# Patient Record
Sex: Female | Born: 1990 | Race: Black or African American | Hispanic: No | Marital: Single | Smoking: Never smoker
Health system: Southern US, Community
[De-identification: ages and names within clinical notes are randomized; demographics above are authoritative.]

## PROBLEM LIST (undated history)

## (undated) DIAGNOSIS — E079 Disorder of thyroid, unspecified: Secondary | ICD-10-CM

---

## 2019-06-22 ENCOUNTER — Encounter: Payer: Self-pay | Admitting: Physician Assistant

## 2019-06-22 ENCOUNTER — Ambulatory Visit (LOCAL_COMMUNITY_HEALTH_CENTER): Payer: Self-pay | Admitting: Physician Assistant

## 2019-06-22 ENCOUNTER — Other Ambulatory Visit: Payer: Self-pay

## 2019-06-22 VITALS — BP 123/82 | Ht 63.0 in | Wt 265.0 lb

## 2019-06-22 DIAGNOSIS — Z3009 Encounter for other general counseling and advice on contraception: Secondary | ICD-10-CM

## 2019-06-22 DIAGNOSIS — E079 Disorder of thyroid, unspecified: Secondary | ICD-10-CM

## 2019-06-22 NOTE — Progress Notes (Signed)
Pt here with concerns about irregular periods. Pt reports last period was 01/2019 that was abnormal. Pt also reports abnormal pap 2015 and had a LEEP procedure after that at [facility].Ronny Bacon, RN

## 2019-06-25 ENCOUNTER — Encounter: Payer: Self-pay | Admitting: Physician Assistant

## 2019-06-25 DIAGNOSIS — E079 Disorder of thyroid, unspecified: Secondary | ICD-10-CM | POA: Insufficient documentation

## 2019-06-25 NOTE — Progress Notes (Signed)
Family Planning Visit  Subjective:  Grace Hodge is a 28 y.o. being seen today to  discuss irregular bleeding.    She is currently using none for pregnancy prevention. Patient reports she does not  want a pregnancy in the next year. Patient  does not have a problem list on file.  Chief Complaint  Patient presents with  . Contraception    Patient reports that she is concerned that she has not had a period since June.  States that she has had a history of irregular periods in the past.  States that had been given "some medicine" in the past to get her period restarted after not having one for a long time.  States that she had a baby in 2013 and then had a Implanon placed.  In 2016, after the Implanon was removed she had irregular periods for a while then got pregnant and delivered 04/2017 by C-section and bled for 2 months, then did not have a period for all of 2019.  Started having periods regularly again in January of this year and had regular periods through May, had 2 periods in June and then none since then.  Patient is G4P3,had EAB in 2010 and has had C-section x 2. Last PE and pap 2018 during/just after pregnancy.  Patient has a thyroid disorder but has not had any follow up since 2018.  Patient states that she has done several pregnancy tests at home since she has not had a period and they are all negative.  States that the last pregnancy test that she has done was on Monday and was negative.  Last sex was 06/21/2019 and without condoms.  States that she is not interested in any hormonal BCM at this time.   Patient denies any other chronic conditions.  Denies any contributory family history.     Does the patient desire a pregnancy in the next year? (OKQ flowsheet)  See flowsheet for other program required questions.   Body mass index is 46.94 kg/m. - Patient is eligible for diabetes screening based on BMI and age >15?  not applicable VV6H ordered? not applicable  Patient reports 1 of  partners in last year. Desires STI screening?  No - .  Does the patient have a current or past history of drug use? No   No components found for: HCV]   Health Maintenance Due  Topic Date Due  . HIV Screening  09/05/2004  . TETANUS/TDAP  09/05/2008  . PAP-Cervical Cytology Screening  09/05/2010  . PAP SMEAR-Modifier  09/05/2010  . INFLUENZA VACCINE  03/03/2019    Review of Systems  All other systems reviewed and are negative.   The following portions of the patient's history were reviewed and updated as appropriate: allergies, current medications, past family history, past medical history, past social history, past surgical history and problem list. Problem list updated.  Objective:   Vitals:   06/22/19 1553  BP: 123/82  Weight: 265 lb (120.2 kg)  Height: 5\' 3"  (1.6 m)    Physical Exam Vitals signs reviewed.  Constitutional:      General: She is not in acute distress.    Appearance: Normal appearance. She is obese.  HENT:     Head: Normocephalic and atraumatic.  Pulmonary:     Effort: Pulmonary effort is normal.  Neurological:     Mental Status: She is alert and oriented to person, place, and time.  Psychiatric:        Mood and Affect: Mood  normal.        Behavior: Behavior normal.        Thought Content: Thought content normal.        Judgment: Judgment normal.       Assessment and Plan:  Grace Hodge is a 28 y.o. female presenting to the [facility] Department for a family planning visit  Contraception counseling: Reviewed all forms of birth control options available including abstinence; over the counter/barrier methods; hormonal contraceptive medication including pill, patch, ring, injection,contraceptive implant; hormonal and nonhormonal IUDs; permanent sterilization options including vasectomy and the various tubal sterilization modalities. Risks and benefits reviewed.  Questions were answered.  Patient desires evaluation for irregular  periods. She will follow up as needed for surveillance.  She was told to call with any further questions, or if she decides to use a hormonal BCM.  Emphasized use of condoms 100% of the time for STI prevention.  1. Encounter for counseling regarding contraception Counseled as above re:  BCMs.  Patient declines any birth control methods at this time. Counseled patient that she needs to follow up with a Gyn or PCP for evaluation for her irregular periods. Counseled that we are not able to follow up on her thyroid levels, other hormone levels, or do an ultrasound to evaluate her concerns. Counseled that it is likely due to her thyroid condition that she is having irregular periods and she may need her medication adjusted but that there are other reasons for her irregular bleeding such as PCOS that need evaluation. PCP list given to patient and reviewed those offices that are sliding fee scale.  Spent > 50% of visit in face to face counseling and history taking.     No follow-ups on file.  No future appointments.  Matt Holmes, PA

## 2021-12-01 ENCOUNTER — Emergency Department
Admission: EM | Admit: 2021-12-01 | Discharge: 2021-12-01 | Disposition: A | Discharge status: Home / Self Care | Payer: No Typology Code available for payment source | Attending: Emergency Medicine | Admitting: Emergency Medicine

## 2021-12-01 ENCOUNTER — Emergency Department: Payer: No Typology Code available for payment source

## 2021-12-01 ENCOUNTER — Other Ambulatory Visit: Payer: Self-pay

## 2021-12-01 DIAGNOSIS — X500XXA Overexertion from strenuous movement or load, initial encounter: Secondary | ICD-10-CM | POA: Diagnosis not present

## 2021-12-01 DIAGNOSIS — S86111A Strain of other muscle(s) and tendon(s) of posterior muscle group at lower leg level, right leg, initial encounter: Secondary | ICD-10-CM | POA: Diagnosis not present

## 2021-12-01 DIAGNOSIS — S8991XA Unspecified injury of right lower leg, initial encounter: Secondary | ICD-10-CM | POA: Diagnosis present

## 2021-12-01 MED ORDER — HYDROCODONE-ACETAMINOPHEN 5-325 MG PO TABS: 1.0000 | ORAL_TABLET | Freq: Four times a day (QID) | ORAL | 0 refills | Status: AC | PRN

## 2021-12-01 MED ORDER — HYDROCODONE-ACETAMINOPHEN 5-325 MG PO TABS
1.0000 | ORAL_TABLET | Freq: Once | ORAL | Status: AC
  Administered 2021-12-01: 1 via ORAL
  Filled 2021-12-01: qty 1

## 2021-12-01 NOTE — Discharge Instructions (Addendum)
-  Take ibuprofen as needed for pain.  Utilize hydrocodone/acetaminophen sparingly. ? ?-Please keep the boot on and utilize crutches whenever ambulating.  You may remove the boot when at rest. ? ?-Follow-up with the orthopedist listed above, as discussed. ? ?-Return to the emergency department anytime if you begin to experience any new or worsening symptoms. ?

## 2021-12-01 NOTE — ED Provider Notes (Signed)
? ?[facility] ?Provider Note ? ? ? Event Date/Time  ? First MD Initiated Contact with Patient 12/01/21 [phone]   ?  (approximate) ? ? ?History  ? ?Chief Complaint ?Leg Pain ? ? ?HPI ?Grace Hodge is a 31 y.o. female, history of thyroid disorder, presents to the emergency department for evaluation of leg pain.  Patient states that she was moving some boxes when a pile of them started following and she jerked trying to get out of the way, when she felt a pop in her right calf and has been having pain ever since.  This event occurred earlier this morning.  Denies numbness/tingling in the affected extremity, cold sensation, fever/chills, knee pain, thigh pain, hip pain, chest pain, shortness of breath, abdominal pain, or head injury. ? ?History Limitations: No limitations. ? ?    ? ? ?Physical Exam  ?Triage Vital Signs: ?ED Triage Vitals  ?Enc Vitals Group  ?   BP   ?   Pulse   ?   Resp   ?   Temp   ?   Temp src   ?   SpO2   ?   Weight   ?   Height   ?   Head Circumference   ?   Peak Flow   ?   Pain Score   ?   Pain Loc   ?   Pain Edu?   ?   Excl. in [place]?   ? ? ?Most recent vital signs: ?Vitals:  ? 12/01/21 0959  ?BP: 107/72  ?Pulse: 87  ?Resp: 16  ?Temp: 98 ?F (36.7 ?C)  ?SpO2: 98%  ? ? ?General: Awake, NAD.  ?Skin: Warm, dry. No rashes or lesions.  ?Eyes: PERRL. Conjunctivae normal.  ?CV: Good peripheral perfusion.  ?Resp: Normal effort.  ?Abd: Soft, non-tender. No distention.  ?Neuro: At baseline. No gross neurological deficits.  ? ?Focused Exam: No gross deformities to the right lower extremity, though there is considerable swelling along the right calf.  No bony tenderness along the tibia/fibula.  No surrounding warmth or erythema pulse, motor, sensation intact.  Thompson test normal.  Normal range of motion of the knee joint.  Patient is able to ambulate on her own without assistance ? ?Physical Exam ? ? ? ?ED Results / Procedures / Treatments  ?Labs ?(all labs ordered are listed, but only  abnormal results are displayed) ?Labs Reviewed - No data to display ? ? ?EKG ?N/A. ? ? ?RADIOLOGY ? ?ED Provider Interpretation: I personally reviewed this x-ray, no evidence of acute fractures based on my interpretation. ? ?DG Tibia/Fibula Right ? ?Result Date: 12/01/2021 ?CLINICAL DATA:  Right leg pain. EXAM: RIGHT TIBIA AND FIBULA - 2 VIEW COMPARISON:  None Available. FINDINGS: There is no evidence of fracture or other focal bone lesions. Soft tissues are unremarkable. IMPRESSION: Negative. Electronically Signed   By: Lupita Raider M.D.   On: 12/01/2021 10:31   ? ?PROCEDURES: ? ?Critical Care performed: None. ? ?Procedures ? ? ? ?MEDICATIONS ORDERED IN ED: ?Medications  ?HYDROcodone-acetaminophen (NORCO/VICODIN) 5-325 MG per tablet 1 tablet (1 tablet Oral Given 12/01/21 1126)  ? ? ? ?IMPRESSION / MDM / ASSESSMENT AND PLAN / ED COURSE  ?I reviewed the triage vital signs and the nursing notes. ?             ?               ? ?Differential diagnosis includes, but is not limited to,  gastrocnemius strain, gastrocnemius tear/rupture, Achilles tendon injury, tibia/fibula fracture. ? ?ED Course ?Patient appears well, vitals within normal limits.  NAD.  We will go ahead treat pain with hydrocodone/acetaminophen ? ?Assessment/Plan ?Presentation consistent with gastrocnemius strain/tear.  Patient still neurovascularly intact.  No evidence of infection.  She still able to ambulate on her own.  No evidence of compartment syndrome.  X-ray reassuring for no evidence of tibia/fibula fracture.  Will provide patient with a cam boot, crutches, and a prescription for a short-term supply of hydrocodone/acetaminophen.  Recommend that she follow-up with orthopedics soon for further evaluation management.  We will plan discharge. ? ?Provided the patient with anticipatory guidance, return precautions, and educational material. Encouraged the patient to return to the emergency department at any time if they begin to experience any new or  worsening symptoms. Patient expressed understanding and agreed with the plan.  ? ?  ? ? ?FINAL CLINICAL IMPRESSION(S) / ED DIAGNOSES  ? ?Final diagnoses:  ?Gastrocnemius muscle tear, right, initial encounter  ? ? ? ?Rx / DC Orders  ? ?ED Discharge Orders   ? ?      Ordered  ?  HYDROcodone-acetaminophen (NORCO/VICODIN) 5-325 MG tablet  Every 6 hours PRN       ? 12/01/21 1123  ? ?  ?  ? ?  ? ? ? ?Note:  This document was prepared using Dragon voice recognition software and may include unintentional dictation errors. ?  ?Varney Daily, [place] ?12/01/21 1130 ? ?  ?Sharman Cheek, MD ?12/01/21 1412 ? ?

## 2021-12-01 NOTE — ED Notes (Signed)
Pt A&O, pt given discharge instructions, pt ambulating steady with crutches, pt assisted by family out in wheelchair. ?

## 2021-12-01 NOTE — ED Triage Notes (Signed)
Pt states she was moving some boxes and a pile of them was falling and she jerked to get out of the way and felt something pop in her right calf and is having pain since. Denies falling ?

## 2021-12-25 ENCOUNTER — Emergency Department
Admission: EM | Admit: 2021-12-25 | Discharge: 2021-12-25 | Disposition: A | Discharge status: Home / Self Care | Payer: Managed Care, Other (non HMO) | Attending: Emergency Medicine | Admitting: Emergency Medicine

## 2021-12-25 ENCOUNTER — Emergency Department: Payer: Managed Care, Other (non HMO)

## 2021-12-25 ENCOUNTER — Other Ambulatory Visit: Payer: Self-pay

## 2021-12-25 ENCOUNTER — Encounter: Payer: Self-pay | Admitting: Intensive Care

## 2021-12-25 DIAGNOSIS — M79661 Pain in right lower leg: Secondary | ICD-10-CM | POA: Diagnosis present

## 2021-12-25 HISTORY — DX: Disorder of thyroid, unspecified: E07.9

## 2021-12-25 MED ORDER — ONDANSETRON 4 MG PO TBDP: 4.0000 mg | ORAL_TABLET | Freq: Three times a day (TID) | ORAL | 0 refills | Status: AC | PRN

## 2021-12-25 MED ORDER — ONDANSETRON 4 MG PO TBDP
4.0000 mg | ORAL_TABLET | Freq: Once | ORAL | Status: AC
  Administered 2021-12-25: 4 mg via ORAL
  Filled 2021-12-25: qty 1

## 2021-12-25 MED ORDER — OXYCODONE-ACETAMINOPHEN 5-325 MG PO TABS: 1.0000 | ORAL_TABLET | Freq: Four times a day (QID) | ORAL | 0 refills | Status: AC | PRN

## 2021-12-25 MED ORDER — OXYCODONE-ACETAMINOPHEN 5-325 MG PO TABS
1.0000 | ORAL_TABLET | Freq: Once | ORAL | Status: AC
  Administered 2021-12-25: 1 via ORAL
  Filled 2021-12-25: qty 1

## 2021-12-25 NOTE — ED Provider Notes (Signed)
[facility] Provider Note  Patient Contact: 3:50 PM (approximate)   History   Foot Pain (right)   HPI  Grace Hodge is a 31 y.o. female presents to the emergency department with persistent right-sided calf pain and pain around the Achilles tendon.  Patient was seen and evaluated on March 2 and had x-rays of the right tibia and fibula after patient was backing away from falling boxes and heard a pop along the inferior aspect of her right calf.  Patient has been wearing her walking boot as directed and has taken her Percocet as prescribed but states that her pain has persisted.  She does have a follow-up appointment with orthopedics anticipated for May 30.  She states that she has been sitting more than usual and does have worsening right calf pain.      Physical Exam   Triage Vital Signs: ED Triage Vitals  Enc Vitals Group     BP 12/25/21 1415 (!) 144/93     Pulse Rate 12/25/21 1415 (!) 102     Resp 12/25/21 1415 16     Temp 12/25/21 1415 99.3 F (37.4 C)     Temp Source 12/25/21 1415 Oral     SpO2 12/25/21 1415 97 %     Weight 12/25/21 1416 211 lb (95.7 kg)     Height 12/25/21 1416 5\' 2"  (1.575 m)     Head Circumference --      Peak Flow --      Pain Score 12/25/21 1415 7     Pain Loc --      Pain Edu? --      Excl. in [place]? --     Most recent vital signs: Vitals:   12/25/21 1415  BP: (!) 144/93  Pulse: (!) 102  Resp: 16  Temp: 99.3 F (37.4 C)  SpO2: 97%     General: Alert and in no acute distress. Eyes:  PERRL. EOMI. Head: No acute traumatic findings ENT:      Ears: Tms are pearly.      Nose: No congestion/rhinnorhea.      Mouth/Throat: Mucous membranes are moist. Neck: No stridor. No cervical spine tenderness to palpation. Cardiovascular:  Good peripheral perfusion Respiratory: Normal respiratory effort without tachypnea or retractions. Lungs CTAB. Good air entry to the bases with no decreased or absent breath  sounds. Gastrointestinal: Bowel sounds 4 quadrants. Soft and nontender to palpation. No guarding or rigidity. No palpable masses. No distention. No CVA tenderness. Musculoskeletal: Full range of motion to all extremities.  Patient has right calf tenderness to palpation with no overlying erythema.  She has exquisite tenderness over the insertion for the Achilles tendon and has diminished strength with plantarflexion.  Palpable dorsalis pedis pulse bilaterally and symmetrically.  Capillary refill less than 2 seconds on the right. Neurologic:  No gross focal neurologic deficits are appreciated.  Skin:   No rash noted Other:   ED Results / Procedures / Treatments   Labs (all labs ordered are listed, but only abnormal results are displayed) Labs Reviewed - No data to display    RADIOLOGY  I personally viewed and evaluated these images as part of my medical decision making, as well as reviewing the written report by the radiologist.  ED Provider Interpretation: No evidence of DVT.  I personally interpreted ultrasound and agree with radiologist interpretation.   PROCEDURES:  Critical Care performed: No  Procedures   MEDICATIONS ORDERED IN ED: Medications  oxyCODONE-acetaminophen (PERCOCET/ROXICET) 5-325 MG  per tablet 1 tablet (1 tablet Oral Given 12/25/21 1621)  ondansetron (ZOFRAN-ODT) disintegrating tablet 4 mg (4 mg Oral Given 12/25/21 1621)     IMPRESSION / MDM / ASSESSMENT AND PLAN / ED COURSE  I reviewed the triage vital signs and the nursing notes.                              Assessment and plan:  Calf pain:  31 year old female presents to the emergency department with persistent right calf pain.  Patient was mildly tachycardic at triage but vital signs were otherwise reassuring.  Patient was alert, active and nontoxic-appearing.  Right calf was very tender to palpation and patient had diminished strength with plantarflexion.  Suspect gastrocnemius tear/strain versus  partial Achilles tendon rupture versus calf hematoma versus DVT.  Venous ultrasound indicated a large 9-1/2 x 2.2 cm hematoma of the right calf.  Recommended warm compresses and gentle massage in the affected area and remaining in Aircast until patient can be evaluated by orthopedics.  Patient was given a short refill of Percocet until she can be evaluated by orthopedics.  She was neurovascular intact prior to discharge.     FINAL CLINICAL IMPRESSION(S) / ED DIAGNOSES   Final diagnoses:  Right calf pain     Rx / DC Orders   ED Discharge Orders          Ordered    oxyCODONE-acetaminophen (PERCOCET/ROXICET) 5-325 MG tablet  Every 6 hours PRN        12/25/21 1757    ondansetron (ZOFRAN-ODT) 4 MG disintegrating tablet  Every 8 hours PRN        12/25/21 1757             Note:  This document was prepared using Dragon voice recognition software and may include unintentional dictation errors.   Vallarie Mare Lyle, Hershal Coria 12/25/21 2107    Harvest Dark, MD 12/26/21 1900

## 2021-12-25 NOTE — ED Triage Notes (Addendum)
Patient presents with aircast on right foot. Reports she is here for pain and wants someone to look at her ankle. Was given oxy and ibuprofen but only taking the ibuprofen. Is suppose to follow up with orthopedics May 30th

## 2021-12-25 NOTE — Discharge Instructions (Addendum)
Keep appointment with orthopedics. Wear boot at home.

## 2021-12-25 NOTE — ED Notes (Signed)
DC ppw provided to patient. RX info and followup information reviewed. PT questions answered. pt declines vs at time of DC. PT provides verbal consent for dc. Pt ambulatory to lobby alert and oriented x4. 

## 2022-09-02 DIAGNOSIS — Z419 Encounter for procedure for purposes other than remedying health state, unspecified: Secondary | ICD-10-CM | POA: Diagnosis not present

## 2022-09-15 DIAGNOSIS — Z3A13 13 weeks gestation of pregnancy: Secondary | ICD-10-CM | POA: Diagnosis not present

## 2022-09-15 DIAGNOSIS — O0991 Supervision of high risk pregnancy, unspecified, first trimester: Secondary | ICD-10-CM | POA: Diagnosis not present

## 2022-09-15 DIAGNOSIS — Z3A15 15 weeks gestation of pregnancy: Secondary | ICD-10-CM | POA: Diagnosis not present

## 2022-09-15 DIAGNOSIS — O99212 Obesity complicating pregnancy, second trimester: Secondary | ICD-10-CM | POA: Diagnosis not present

## 2022-09-15 DIAGNOSIS — O0992 Supervision of high risk pregnancy, unspecified, second trimester: Secondary | ICD-10-CM | POA: Diagnosis not present

## 2022-09-15 DIAGNOSIS — O3680X0 Pregnancy with inconclusive fetal viability, not applicable or unspecified: Secondary | ICD-10-CM | POA: Diagnosis not present

## 2022-09-15 DIAGNOSIS — Z3689 Encounter for other specified antenatal screening: Secondary | ICD-10-CM | POA: Diagnosis not present

## 2022-10-01 DIAGNOSIS — Z419 Encounter for procedure for purposes other than remedying health state, unspecified: Secondary | ICD-10-CM | POA: Diagnosis not present

## 2022-10-07 DIAGNOSIS — O0992 Supervision of high risk pregnancy, unspecified, second trimester: Secondary | ICD-10-CM | POA: Diagnosis not present

## 2022-10-07 DIAGNOSIS — Z3A18 18 weeks gestation of pregnancy: Secondary | ICD-10-CM | POA: Diagnosis not present

## 2022-10-11 DIAGNOSIS — O99212 Obesity complicating pregnancy, second trimester: Secondary | ICD-10-CM | POA: Diagnosis not present

## 2022-10-11 DIAGNOSIS — Z3A17 17 weeks gestation of pregnancy: Secondary | ICD-10-CM | POA: Diagnosis not present

## 2022-10-11 DIAGNOSIS — Z3A19 19 weeks gestation of pregnancy: Secondary | ICD-10-CM | POA: Diagnosis not present

## 2022-10-11 DIAGNOSIS — O43122 Velamentous insertion of umbilical cord, second trimester: Secondary | ICD-10-CM | POA: Diagnosis not present

## 2022-11-01 DIAGNOSIS — Z419 Encounter for procedure for purposes other than remedying health state, unspecified: Secondary | ICD-10-CM | POA: Diagnosis not present

## 2022-11-04 DIAGNOSIS — E039 Hypothyroidism, unspecified: Secondary | ICD-10-CM | POA: Diagnosis not present

## 2022-11-04 DIAGNOSIS — O0992 Supervision of high risk pregnancy, unspecified, second trimester: Secondary | ICD-10-CM | POA: Diagnosis not present

## 2022-11-04 DIAGNOSIS — Z3A22 22 weeks gestation of pregnancy: Secondary | ICD-10-CM | POA: Diagnosis not present

## 2022-11-10 DIAGNOSIS — O133 Gestational [pregnancy-induced] hypertension without significant proteinuria, third trimester: Secondary | ICD-10-CM | POA: Diagnosis not present

## 2022-12-01 DIAGNOSIS — Z419 Encounter for procedure for purposes other than remedying health state, unspecified: Secondary | ICD-10-CM | POA: Diagnosis not present

## 2022-12-10 DIAGNOSIS — O09212 Supervision of pregnancy with history of pre-term labor, second trimester: Secondary | ICD-10-CM | POA: Diagnosis not present

## 2022-12-10 DIAGNOSIS — O99212 Obesity complicating pregnancy, second trimester: Secondary | ICD-10-CM | POA: Diagnosis not present

## 2022-12-10 DIAGNOSIS — O321XX0 Maternal care for breech presentation, not applicable or unspecified: Secondary | ICD-10-CM | POA: Diagnosis not present

## 2022-12-10 DIAGNOSIS — Z3A25 25 weeks gestation of pregnancy: Secondary | ICD-10-CM | POA: Diagnosis not present

## 2022-12-10 DIAGNOSIS — Z3A27 27 weeks gestation of pregnancy: Secondary | ICD-10-CM | POA: Diagnosis not present

## 2022-12-10 DIAGNOSIS — O43122 Velamentous insertion of umbilical cord, second trimester: Secondary | ICD-10-CM | POA: Diagnosis not present

## 2022-12-10 DIAGNOSIS — O99282 Endocrine, nutritional and metabolic diseases complicating pregnancy, second trimester: Secondary | ICD-10-CM | POA: Diagnosis not present

## 2022-12-10 DIAGNOSIS — E890 Postprocedural hypothyroidism: Secondary | ICD-10-CM | POA: Diagnosis not present

## 2022-12-10 DIAGNOSIS — R519 Headache, unspecified: Secondary | ICD-10-CM | POA: Diagnosis not present

## 2022-12-10 DIAGNOSIS — Z8759 Personal history of other complications of pregnancy, childbirth and the puerperium: Secondary | ICD-10-CM | POA: Diagnosis not present

## 2022-12-10 DIAGNOSIS — O26892 Other specified pregnancy related conditions, second trimester: Secondary | ICD-10-CM | POA: Diagnosis not present

## 2022-12-10 DIAGNOSIS — Z98891 History of uterine scar from previous surgery: Secondary | ICD-10-CM | POA: Diagnosis not present

## 2022-12-10 DIAGNOSIS — Z23 Encounter for immunization: Secondary | ICD-10-CM | POA: Diagnosis not present

## 2022-12-27 DIAGNOSIS — O34211 Maternal care for low transverse scar from previous cesarean delivery: Secondary | ICD-10-CM | POA: Diagnosis not present

## 2022-12-27 DIAGNOSIS — O99214 Obesity complicating childbirth: Secondary | ICD-10-CM | POA: Diagnosis not present

## 2022-12-27 DIAGNOSIS — O09892 Supervision of other high risk pregnancies, second trimester: Secondary | ICD-10-CM | POA: Diagnosis not present

## 2022-12-27 DIAGNOSIS — O34212 Maternal care for vertical scar from previous cesarean delivery: Secondary | ICD-10-CM | POA: Diagnosis not present

## 2022-12-27 DIAGNOSIS — O99284 Endocrine, nutritional and metabolic diseases complicating childbirth: Secondary | ICD-10-CM | POA: Diagnosis not present

## 2022-12-27 DIAGNOSIS — Z7982 Long term (current) use of aspirin: Secondary | ICD-10-CM | POA: Diagnosis not present

## 2022-12-27 DIAGNOSIS — Z8619 Personal history of other infectious and parasitic diseases: Secondary | ICD-10-CM | POA: Diagnosis not present

## 2022-12-27 DIAGNOSIS — Z3A30 30 weeks gestation of pregnancy: Secondary | ICD-10-CM | POA: Diagnosis not present

## 2022-12-27 DIAGNOSIS — Z8759 Personal history of other complications of pregnancy, childbirth and the puerperium: Secondary | ICD-10-CM | POA: Diagnosis not present

## 2022-12-27 DIAGNOSIS — O368130 Decreased fetal movements, third trimester, not applicable or unspecified: Secondary | ICD-10-CM | POA: Diagnosis not present

## 2022-12-27 DIAGNOSIS — E890 Postprocedural hypothyroidism: Secondary | ICD-10-CM | POA: Diagnosis not present

## 2022-12-27 DIAGNOSIS — E039 Hypothyroidism, unspecified: Secondary | ICD-10-CM | POA: Diagnosis not present

## 2022-12-27 DIAGNOSIS — N736 Female pelvic peritoneal adhesions (postinfective): Secondary | ICD-10-CM | POA: Diagnosis not present

## 2022-12-27 DIAGNOSIS — O43193 Other malformation of placenta, third trimester: Secondary | ICD-10-CM | POA: Diagnosis not present

## 2022-12-27 DIAGNOSIS — O99892 Other specified diseases and conditions complicating childbirth: Secondary | ICD-10-CM | POA: Diagnosis not present

## 2022-12-27 DIAGNOSIS — O9081 Anemia of the puerperium: Secondary | ICD-10-CM | POA: Diagnosis not present

## 2022-12-27 DIAGNOSIS — O1414 Severe pre-eclampsia complicating childbirth: Secondary | ICD-10-CM | POA: Diagnosis not present

## 2022-12-28 DIAGNOSIS — Z98891 History of uterine scar from previous surgery: Secondary | ICD-10-CM | POA: Diagnosis not present

## 2022-12-28 DIAGNOSIS — O09892 Supervision of other high risk pregnancies, second trimester: Secondary | ICD-10-CM | POA: Diagnosis not present

## 2022-12-28 DIAGNOSIS — Z8759 Personal history of other complications of pregnancy, childbirth and the puerperium: Secondary | ICD-10-CM | POA: Diagnosis not present

## 2023-01-01 DIAGNOSIS — Z419 Encounter for procedure for purposes other than remedying health state, unspecified: Secondary | ICD-10-CM | POA: Diagnosis not present

## 2023-01-16 DIAGNOSIS — R69 Illness, unspecified: Secondary | ICD-10-CM | POA: Diagnosis not present

## 2023-01-31 DIAGNOSIS — Z419 Encounter for procedure for purposes other than remedying health state, unspecified: Secondary | ICD-10-CM | POA: Diagnosis not present

## 2023-02-09 DIAGNOSIS — D62 Acute posthemorrhagic anemia: Secondary | ICD-10-CM | POA: Diagnosis not present

## 2023-02-09 DIAGNOSIS — E890 Postprocedural hypothyroidism: Secondary | ICD-10-CM | POA: Diagnosis not present

## 2023-02-09 DIAGNOSIS — Z302 Encounter for sterilization: Secondary | ICD-10-CM | POA: Diagnosis not present

## 2023-02-09 DIAGNOSIS — Z8759 Personal history of other complications of pregnancy, childbirth and the puerperium: Secondary | ICD-10-CM | POA: Diagnosis not present

## 2023-03-03 DIAGNOSIS — Z419 Encounter for procedure for purposes other than remedying health state, unspecified: Secondary | ICD-10-CM | POA: Diagnosis not present

## 2023-04-03 DIAGNOSIS — Z419 Encounter for procedure for purposes other than remedying health state, unspecified: Secondary | ICD-10-CM | POA: Diagnosis not present

## 2023-05-03 DIAGNOSIS — Z419 Encounter for procedure for purposes other than remedying health state, unspecified: Secondary | ICD-10-CM | POA: Diagnosis not present

## 2023-05-03 IMAGING — DX DG TIBIA/FIBULA 2V*R*
3 series · 3 of 3 positions shown · non-contrast
Comparison: None Available.

CLINICAL DATA: Right leg pain.

EXAM:
RIGHT TIBIA AND FIBULA - 2 VIEW

[tibia ap]
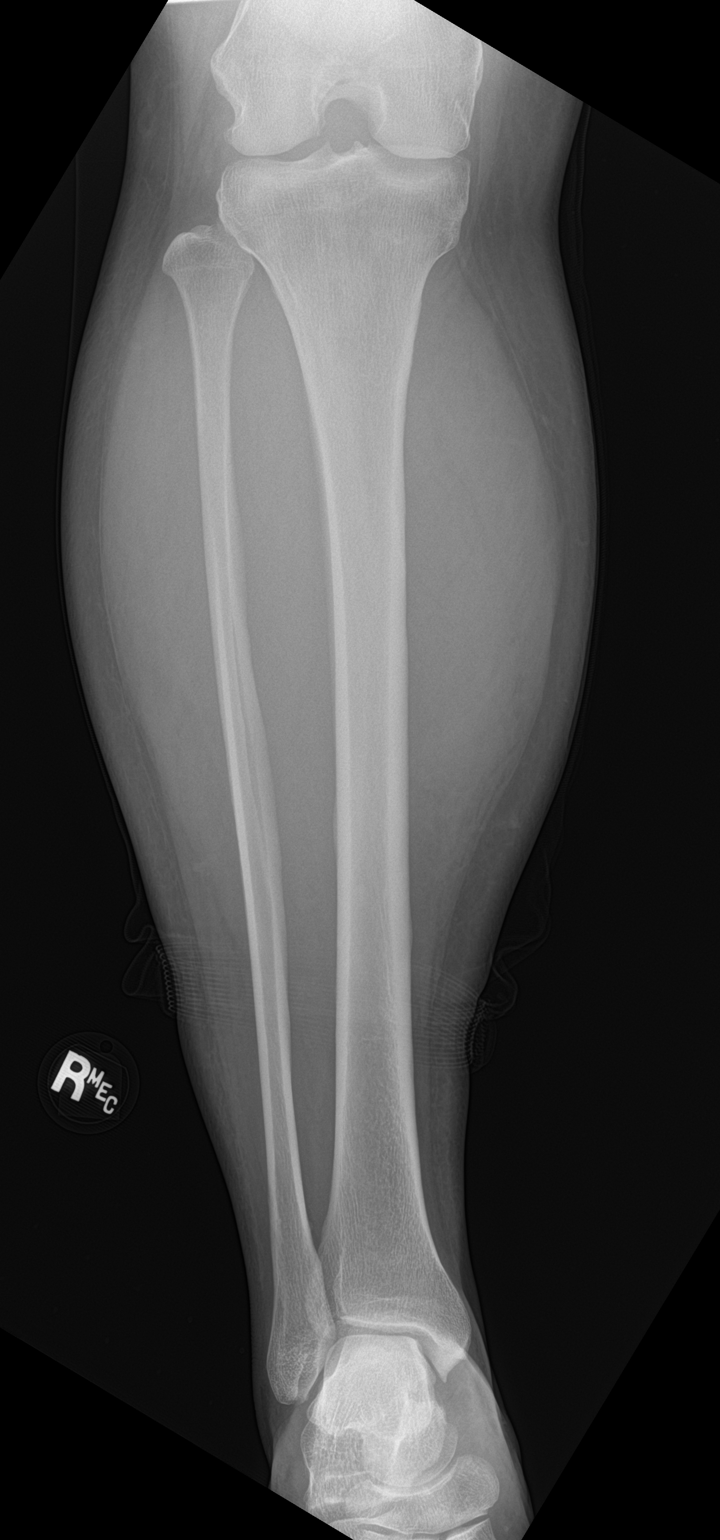

[tibia lat (1 of 2)]
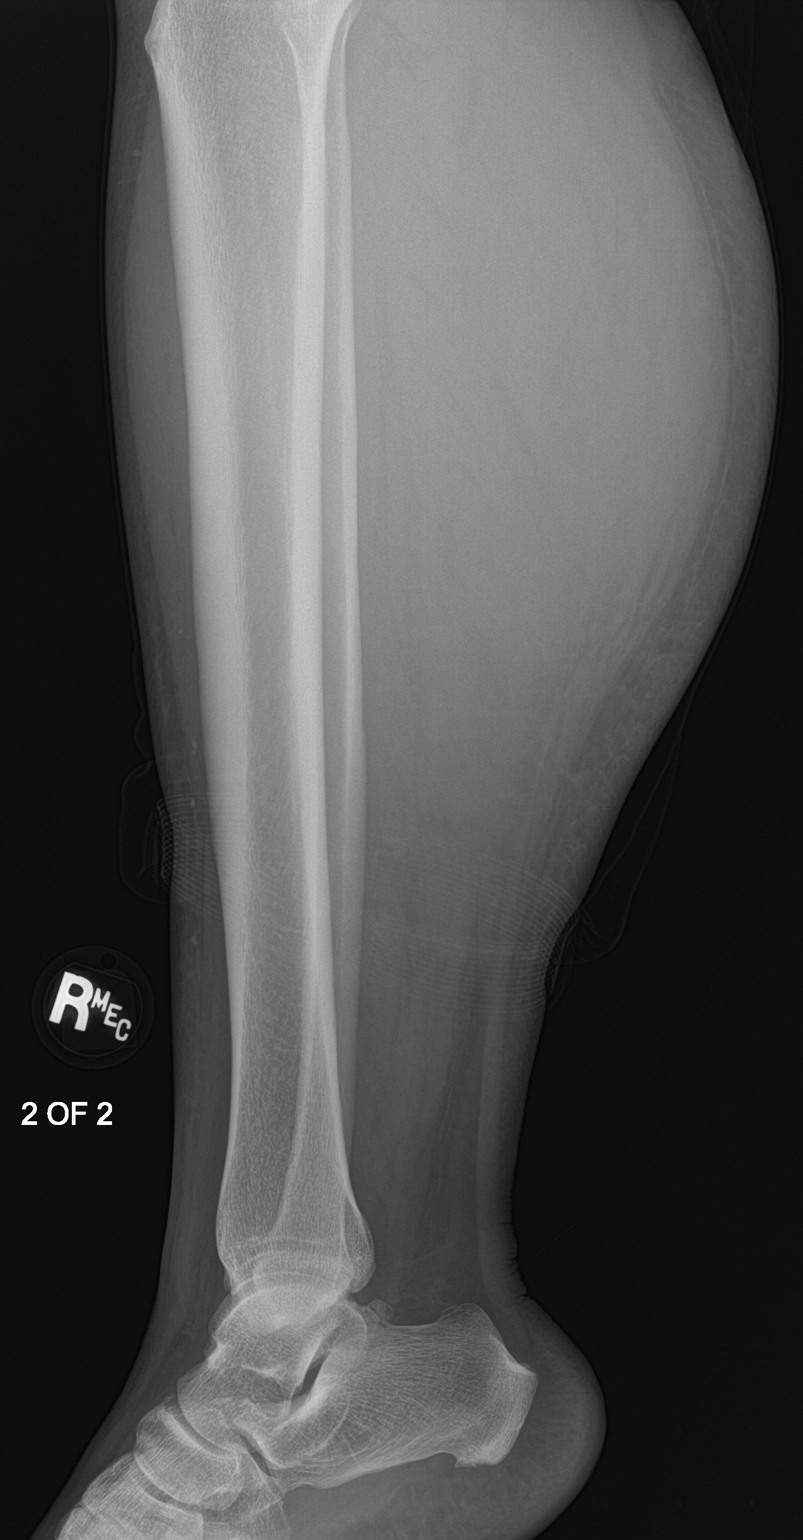

[tibia lat (2 of 2)]
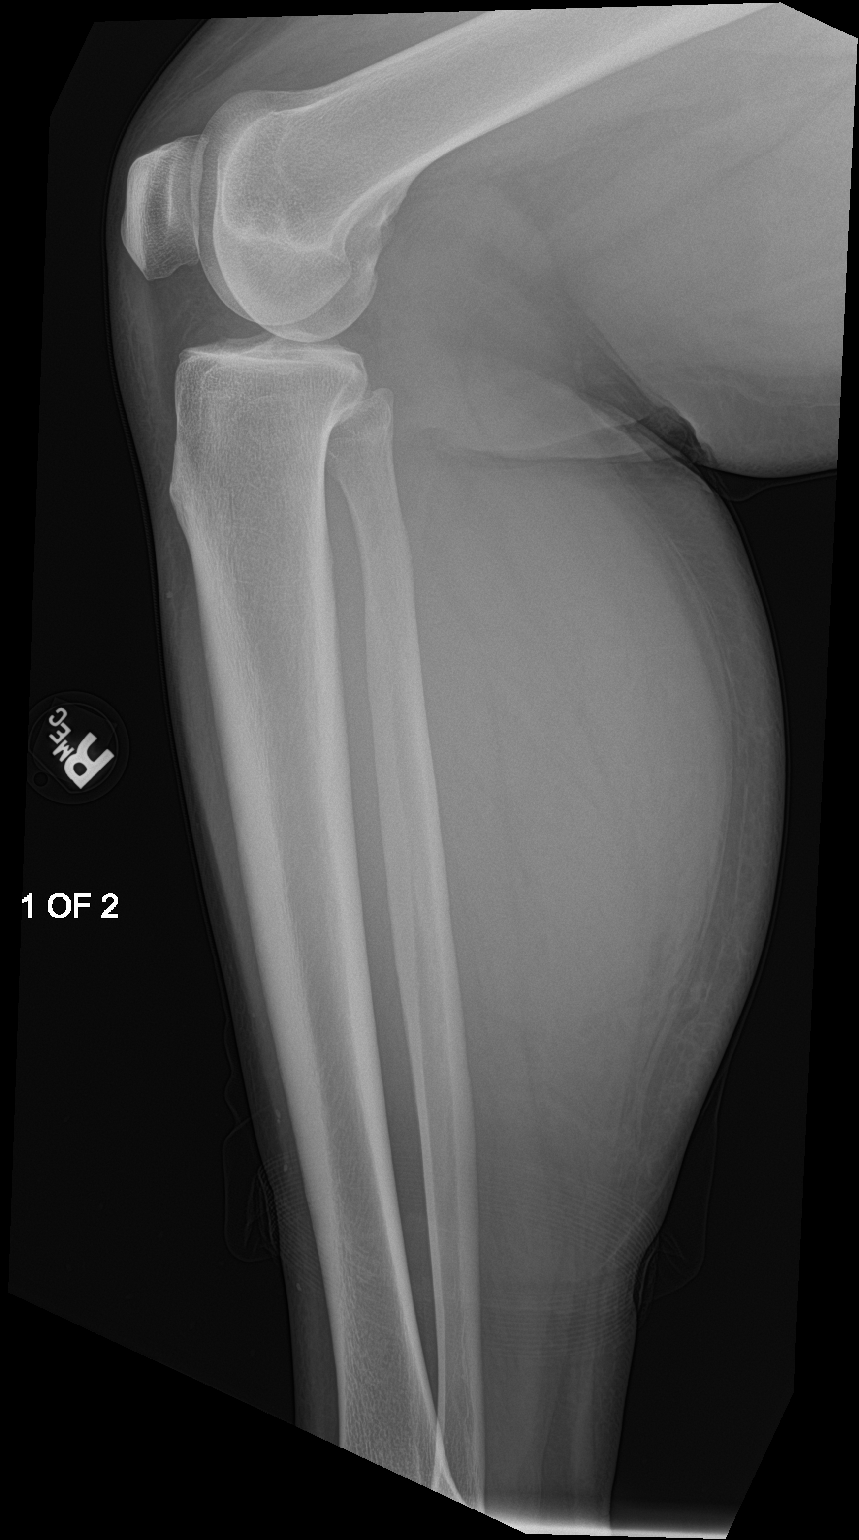

[3 of 3 positions shown; findings below may reference images not displayed]

FINDINGS: There is no evidence of fracture or other focal bone lesions. Soft
tissues are unremarkable.
IMPRESSION: Negative.

## 2023-05-27 IMAGING — US US EXTREM LOW VENOUS*R*
1 series · 14 of 24 positions shown · non-contrast
Comparison: None Available.

CLINICAL DATA: Pain and swelling

EXAM:
Right LOWER EXTREMITY VENOUS DOPPLER ULTRASOUND
TECHNIQUE: Gray-scale sonography with compression, as well as color and duplex
ultrasound, were performed to evaluate the deep venous system(s)
from the level of the common femoral vein through the popliteal and
proximal calf veins.

[Series 1: us venous img lower uni right (dvt) · portal-venous · 14 of 42 slices shown]
[im 1/42]
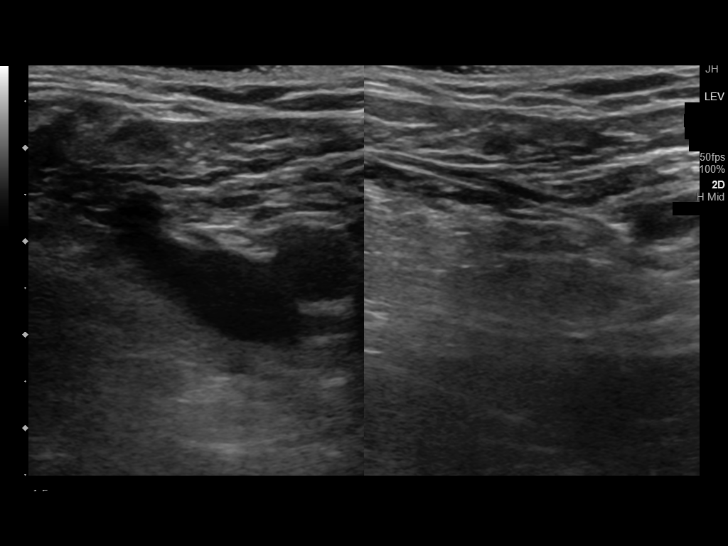
[im 4/42]
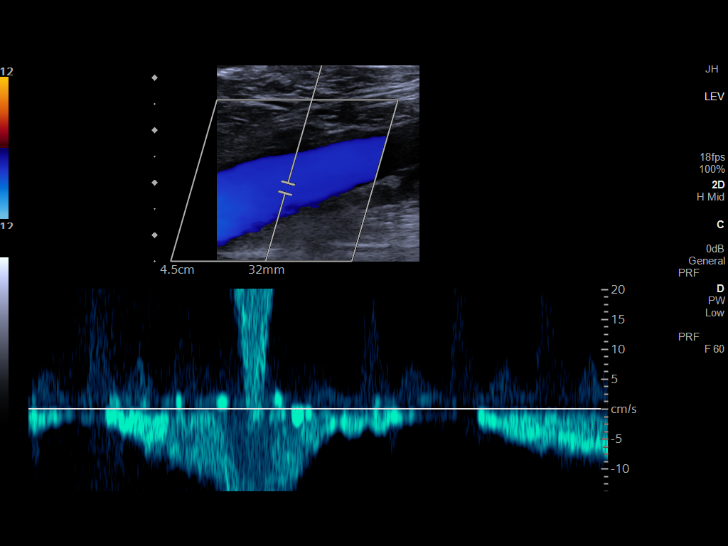
[im 8/42]
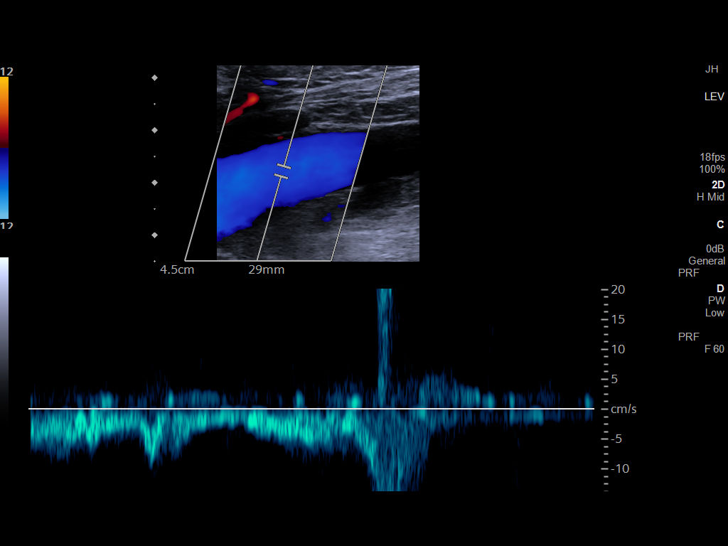
[im 11/42]
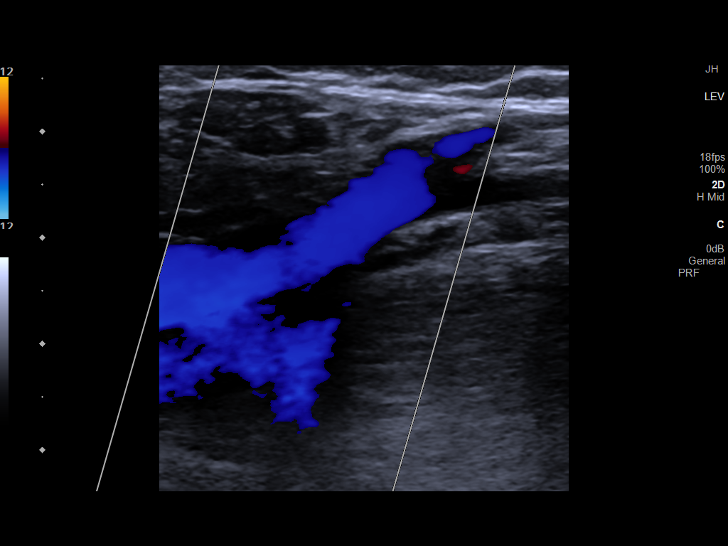
[im 13/42]
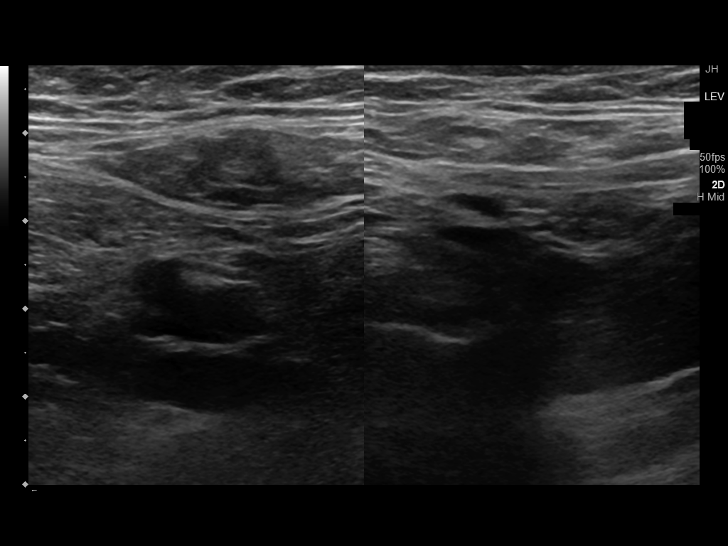
[im 17/42]
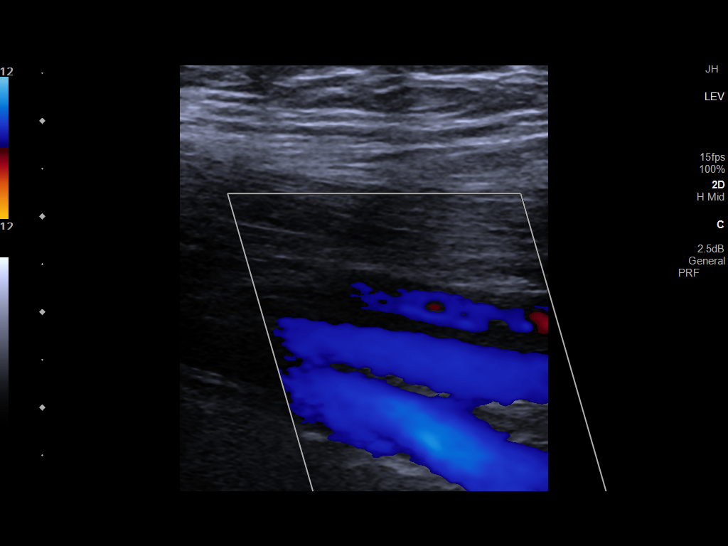
[im 20/42]
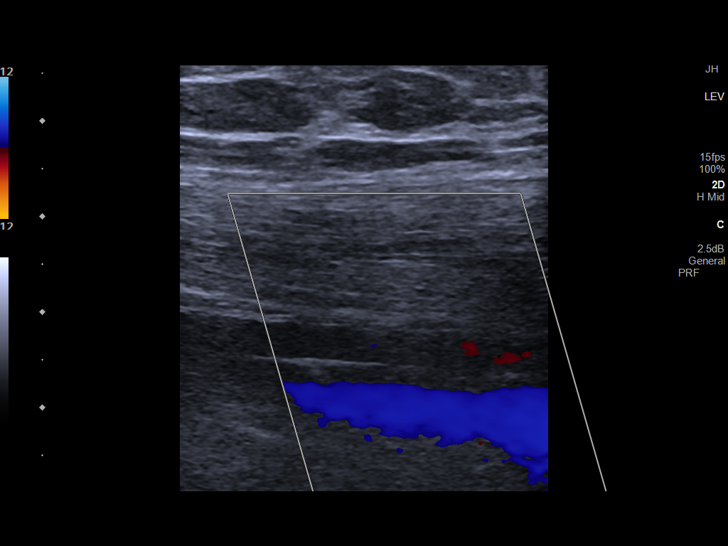
[im 22/42]
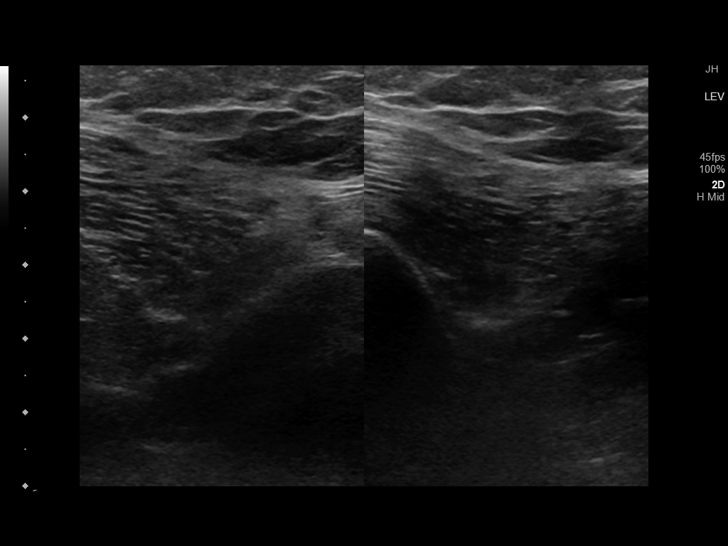
[im 25/42]
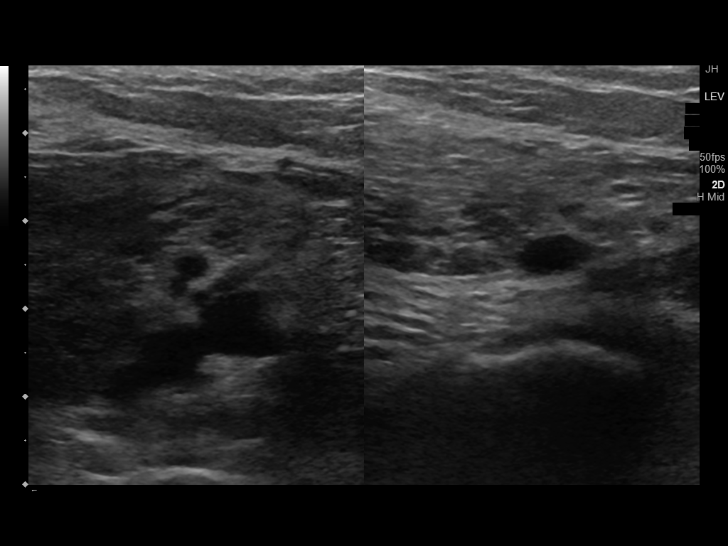
[im 29/42]
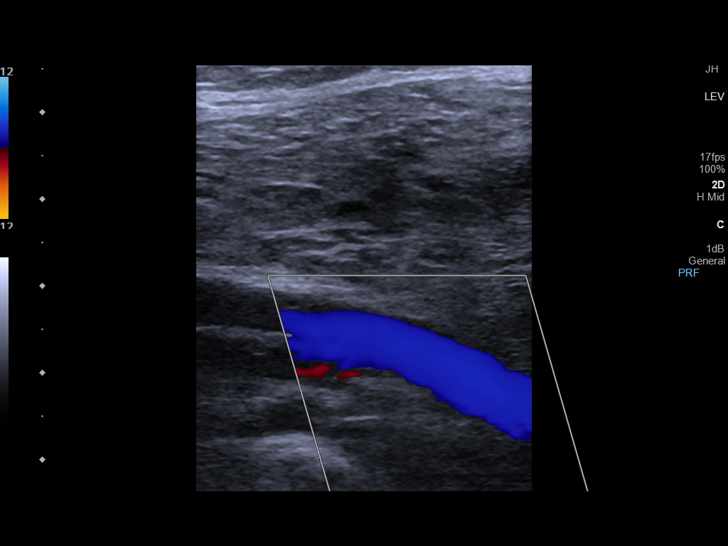
[im 33/42]
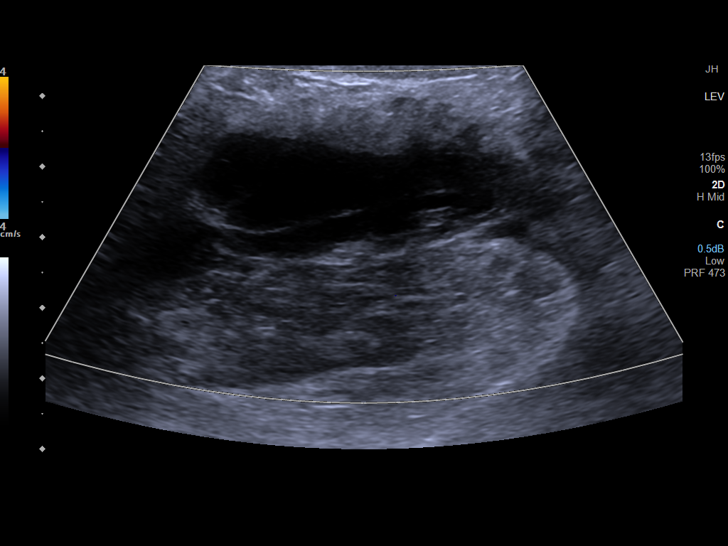
[im 34/42]
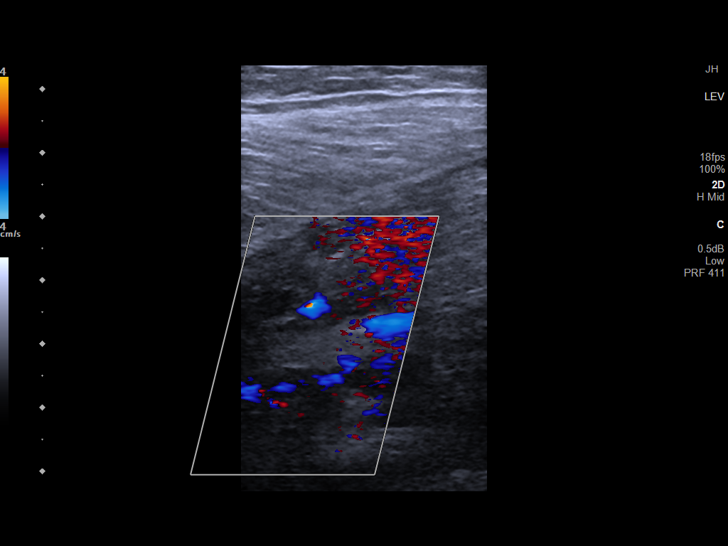
[im 38/42]
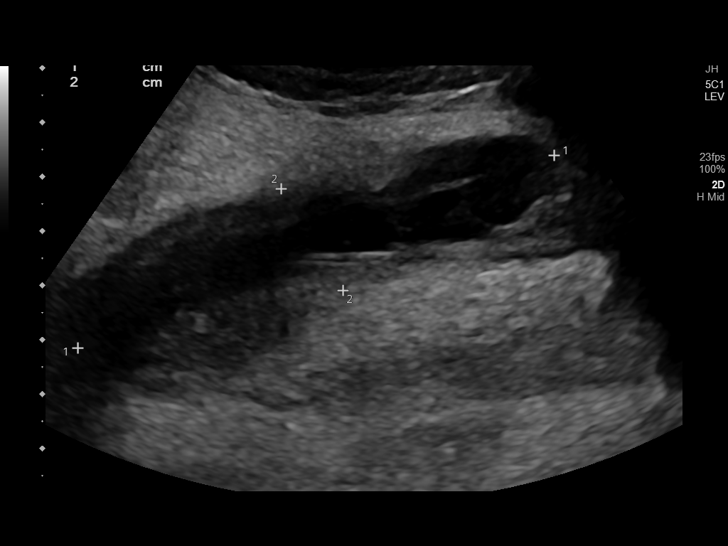
[im 42/42]
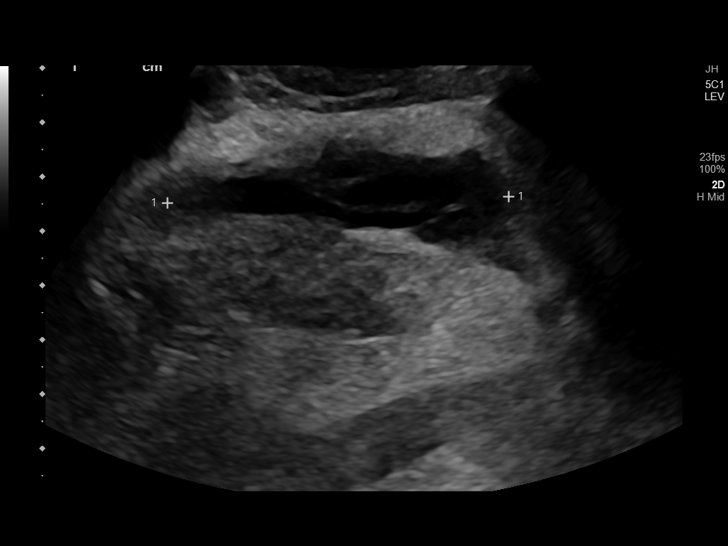

[14 of 24 positions shown; findings below may reference images not displayed]

FINDINGS: VENOUS

Normal compressibility of the common femoral, superficial femoral,
and popliteal veins, as well as the visualized calf veins.
Visualized portions of profunda femoral vein and great saphenous
vein unremarkable. No filling defects to suggest DVT on grayscale or
color Doppler imaging. Doppler waveforms show normal direction of
venous flow, normal respiratory plasticity and response to
augmentation.

Limited views of the contralateral common femoral vein are
unremarkable.

OTHER

There is a complex fluid collection measuring approximally 9.5 x
cm in the upper medial right calf.

Limitations: none
IMPRESSION: There is no evidence of DVT in the right lower extremity.

There is 9.5 x 2.2 cm complex fluid collection in the upper medial
right calf suggesting possible resolving hematoma.

## 2023-06-03 DIAGNOSIS — Z419 Encounter for procedure for purposes other than remedying health state, unspecified: Secondary | ICD-10-CM | POA: Diagnosis not present

## 2023-07-03 DIAGNOSIS — Z419 Encounter for procedure for purposes other than remedying health state, unspecified: Secondary | ICD-10-CM | POA: Diagnosis not present

## 2023-08-03 DIAGNOSIS — Z419 Encounter for procedure for purposes other than remedying health state, unspecified: Secondary | ICD-10-CM | POA: Diagnosis not present

## 2023-09-03 DIAGNOSIS — Z419 Encounter for procedure for purposes other than remedying health state, unspecified: Secondary | ICD-10-CM | POA: Diagnosis not present

## 2023-10-01 DIAGNOSIS — Z419 Encounter for procedure for purposes other than remedying health state, unspecified: Secondary | ICD-10-CM | POA: Diagnosis not present

## 2023-11-12 DIAGNOSIS — Z419 Encounter for procedure for purposes other than remedying health state, unspecified: Secondary | ICD-10-CM | POA: Diagnosis not present

## 2023-12-12 DIAGNOSIS — Z419 Encounter for procedure for purposes other than remedying health state, unspecified: Secondary | ICD-10-CM | POA: Diagnosis not present

## 2024-01-12 DIAGNOSIS — Z419 Encounter for procedure for purposes other than remedying health state, unspecified: Secondary | ICD-10-CM | POA: Diagnosis not present

## 2024-02-11 DIAGNOSIS — Z419 Encounter for procedure for purposes other than remedying health state, unspecified: Secondary | ICD-10-CM | POA: Diagnosis not present

## 2024-03-13 DIAGNOSIS — Z419 Encounter for procedure for purposes other than remedying health state, unspecified: Secondary | ICD-10-CM | POA: Diagnosis not present

## 2024-04-13 DIAGNOSIS — Z419 Encounter for procedure for purposes other than remedying health state, unspecified: Secondary | ICD-10-CM | POA: Diagnosis not present
